# Patient Record
Sex: Female | Born: 2000 | Race: Black or African American | Hispanic: No | Marital: Single | Smoking: Never smoker
Health system: Southern US, Community
[De-identification: ages and names within clinical notes are randomized; demographics above are authoritative.]

---

## 2019-01-11 ENCOUNTER — Encounter (HOSPITAL_COMMUNITY): Payer: Self-pay | Admitting: Obstetrics and Gynecology

## 2019-01-11 ENCOUNTER — Other Ambulatory Visit: Payer: Self-pay

## 2019-01-11 DIAGNOSIS — R070 Pain in throat: Secondary | ICD-10-CM | POA: Insufficient documentation

## 2019-01-11 NOTE — ED Triage Notes (Signed)
Pt reports she has been sick the last 4 days. Pt reports emesis, diarrhea and sore throat. Pt reports she feels like it is getting harder to swallow.

## 2019-01-12 ENCOUNTER — Emergency Department (HOSPITAL_COMMUNITY)
Admission: EM | Admit: 2019-01-12 | Discharge: 2019-01-12 | Disposition: A | Discharge status: Home / Self Care | Payer: Self-pay | Attending: Emergency Medicine | Admitting: Emergency Medicine

## 2019-01-12 DIAGNOSIS — J029 Acute pharyngitis, unspecified: Secondary | ICD-10-CM

## 2019-01-12 LAB — GROUP A STREP BY PCR: Group A Strep by PCR: NOT DETECTED

## 2019-01-12 LAB — MONONUCLEOSIS SCREEN: Mono Screen: NEGATIVE

## 2019-01-12 MED ORDER — LIDOCAINE VISCOUS HCL 2 % MT SOLN: 15.0000 mL | OROMUCOSAL | 0 refills | Status: DC | PRN

## 2019-01-12 NOTE — ED Provider Notes (Signed)
[facility] DEPT Provider Note   CSN: [id number] Arrival date & time: 01/11/19  2105     History   Chief Complaint Chief Complaint  Patient presents with  . Sore Throat    HPI Tiffany Cervantes is a 18 y.o. female.     The history is provided by the patient and medical records.    18 year old female presenting to the ED with multiple complaints.  She reports 4 days ago she started having nausea, vomiting, and diarrhea along with some abdominal cramping.  She was drinking tea and Pedialyte at home which seemed to help.  She has not thrown up since yesterday but reports of the past 2 days she has felt like there is something stuck in the right side of her throat.  States it has felt this way since she ate some rice.  States she is tried drinking a lot of fluids, eating bread, but sensation is still there.  She denies any current abdominal pain.  She is not had any fevers.  She denies any sick contacts.  She has not had any known COVID exposure.  She denies any cough, shortness of breath, or other respiratory symptoms.  No past medical history on file.  There are no active problems to display for this patient.      OB History    Gravida      Para      Term      Preterm      AB      Living  0     SAB      TAB      Ectopic      Multiple      Live Births               Home Medications    Prior to Admission medications   Not on File    Family History No family history on file.  Social History Social History   Tobacco Use  . Smoking status: Never Smoker  . Smokeless tobacco: Never Used  Substance Use Topics  . Alcohol use: Yes    Comment: Socially  . Drug use: Yes    Frequency: 7.0 times per week    Types: Marijuana     Allergies   Patient has no allergy information on record.   Review of Systems Review of Systems  HENT: Positive for sore throat.   All other systems reviewed and are negative.     Physical Exam Updated Vital Signs BP 120/87 (BP Location: Left Arm)   Pulse 62   Temp 98.9 F (37.2 C) (Oral)   Resp 18   Wt 59.1 kg   LMP 12/28/2018 (Approximate)   SpO2 98%   Physical Exam Vitals signs and nursing note reviewed.  Constitutional:      Appearance: She is well-developed.  HENT:     Head: Normocephalic and atraumatic.     Right Ear: Tympanic membrane and ear canal normal.     Left Ear: Tympanic membrane and ear canal normal.     Nose: Nose normal.     Mouth/Throat:     Lips: Pink.     Mouth: Mucous membranes are moist.     Pharynx: Oropharynx is clear.     Comments: Tonsils overall normal in appearance bilaterally without exudate; uvula midline without evidence of peritonsillar abscess; handling secretions appropriately; no difficulty swallowing or speaking; normal phonation without stridor Eyes:     Conjunctiva/sclera: Conjunctivae normal.  Pupils: Pupils are equal, round, and reactive to light.  Neck:     Musculoskeletal: Normal range of motion.     Comments: No lymphadenopathy, no masses, no thyromegaly noted Cardiovascular:     Rate and Rhythm: Normal rate and regular rhythm.     Heart sounds: Normal heart sounds.  Pulmonary:     Effort: Pulmonary effort is normal.     Breath sounds: Normal breath sounds.  Abdominal:     General: Bowel sounds are normal.     Palpations: Abdomen is soft.  Musculoskeletal: Normal range of motion.  Skin:    General: Skin is warm and dry.  Neurological:     Mental Status: She is alert and oriented to person, place, and time.      ED Treatments / Results  Labs (all labs ordered are listed, but only abnormal results are displayed) Labs Reviewed  GROUP A STREP BY PCR  MONONUCLEOSIS SCREEN    EKG    Radiology No results found.  Procedures Procedures (including critical care time)  Medications Ordered in ED Medications - No data to display   Initial Impression / Assessment and Plan / ED Course  I  have reviewed the triage vital signs and the nursing notes.  Pertinent labs & imaging results that were available during my care of the patient were reviewed by me and considered in my medical decision making (see chart for details).  18 year old female presenting to the ED with sore throat and sensation that something is "stuck" in the right side of her throat.  States she has felt this way since eating rice.  She did have some nausea, vomiting, diarrhea earlier in the week but this has since resolved.  She is afebrile and nontoxic in appearance.  Denies any sick exposures or known COVID exposures.  She does not have any tonsillar edema or exudates here.  No lymphadenopathy or masses.  She is handling secretions well, normal phonation without stridor.  Rapid strep and Monospot are both negative.  I discussed with the patient that there is still a possibility of COVID given national pandemic, however her symptoms are not overly concerning for such.  I have offered her COVID screen here, however she declined due to concern of discomfort.  We will have her monitor her symptoms closely at home, Rx viscous lidocaine for discomfort.  Close follow-up with PCP.  Return here for any new or acute changes.  Tiffany SchimkeBreona Cervantes was evaluated in Emergency Department on 01/12/2019 for the symptoms described in the history of present illness. She was evaluated in the context of the global COVID-19 pandemic, which necessitated consideration that the patient might be at risk for infection with the SARS-CoV-2 virus that causes COVID-19. Institutional protocols and algorithms that pertain to the evaluation of patients at risk for COVID-19 are in a state of rapid change based on information released by regulatory bodies including the CDC and federal and state organizations. These policies and algorithms were followed during the patient's care in the ED.   Final Clinical Impressions(s) / ED Diagnoses   Final diagnoses:  Sore  throat    ED Discharge Orders         Ordered    lidocaine (XYLOCAINE) 2 % solution  As needed     01/12/19 0313           Garlon HatchetSanders, Riti Rollyson M, PA-C 01/12/19 0326    Nira Connardama, Pedro Eduardo, MD 01/12/19 [phone]

## 2019-01-12 NOTE — ED Notes (Addendum)
Pt verbalized discharge instructions and follow up care. Alert and ambulatory. No iv. Pt calling [employer] to take her home

## 2019-01-12 NOTE — Discharge Instructions (Signed)
Take the prescribed medication as directed. Monitor your symptoms closely.  If you develop fever, cough, shortness of breath, or other concerning symptoms you will need to quarantine at home given presence of pandemic.  See CDC guidelines below. Follow-up with primary care doctor. Return to the ED for new or worsening symptoms.     Person Under Monitoring Name: Tiffany SchimkeBreona Cervantes  Location[place] [address] [place] [id number]   Infection Prevention Recommendations for Individuals Confirmed to have, or Being Evaluated for, 2019 Novel Coronavirus (COVID-19) Infection Who Receive Care at Home  Individuals who are confirmed to have, or are being evaluated for, COVID-19 should follow the prevention steps below until a healthcare provider or local or state health department says they can return to normal activities.  Stay home except to get medical care You should restrict activities outside your home, except for getting medical care. Do not go to work, school, or public areas, and do not use public transportation or taxis.  Call ahead before visiting your doctor Before your medical appointment, call the healthcare provider and tell them that you have, or are being evaluated for, COVID-19 infection. This will help the healthcare providers office take steps to keep other people from getting infected. Ask your healthcare provider to call the local or state health department.  Monitor your symptoms Seek prompt medical attention if your illness is worsening (e.g., difficulty breathing). Before going to your medical appointment, call the healthcare provider and tell them that you have, or are being evaluated for, COVID-19 infection. Ask your healthcare provider to call the local or state health department.  Wear a facemask You should wear a facemask that covers your nose and mouth when you are in the same room with other people and when you visit a healthcare provider. People who  live with or visit you should also wear a facemask while they are in the same room with you.  Separate yourself from other people in your home As much as possible, you should stay in a different room from other people in your home. Also, you should use a separate bathroom, if available.  Avoid sharing household items You should not share dishes, drinking glasses, cups, eating utensils, towels, bedding, or other items with other people in your home. After using these items, you should wash them thoroughly with soap and water.  Cover your coughs and sneezes Cover your mouth and nose with a tissue when you cough or sneeze, or you can cough or sneeze into your sleeve. Throw used tissues in a lined trash can, and immediately wash your hands with soap and water for at least 20 seconds or use an alcohol-based hand rub.  Wash your [employer]hands Wash your hands often and thoroughly with soap and water for at least 20 seconds. You can use an alcohol-based hand sanitizer if soap and water are not available and if your hands are not visibly dirty. Avoid touching your eyes, nose, and mouth with unwashed hands.   Prevention Steps for Caregivers and Household Members of Individuals Confirmed to have, or Being Evaluated for, COVID-19 Infection Being Cared for in the Home  If you live with, or provide care at home for, a person confirmed to have, or being evaluated for, COVID-19 infection please follow these guidelines to prevent infection:  Follow healthcare providers instructions Make sure that you understand and can help the patient follow any healthcare provider instructions for all care.  Provide for the patients basic needs You should help the  patient with basic needs in the home and provide support for getting groceries, prescriptions, and other personal needs.  Monitor the patients symptoms If they are getting sicker, call his or her medical provider and tell them that the patient has, or is  being evaluated for, COVID-19 infection. This will help the healthcare providers office take steps to keep other people from getting infected. Ask the healthcare provider to call the local or state health department.  Limit the number of people who have contact with the patient If possible, have only one caregiver for the patient. Other household members should stay in another home or place of residence. If this is not possible, they should stay in another room, or be separated from the patient as much as possible. Use a separate bathroom, if available. Restrict visitors who do not have an essential need to be in the home.  Keep older adults, very young children, and other sick people away from the patient Keep older adults, very young children, and those who have compromised immune systems or chronic health conditions away from the patient. This includes people with chronic heart, lung, or kidney conditions, diabetes, and cancer.  Ensure good ventilation Make sure that shared spaces in the home have good air flow, such as from an air conditioner or an opened window, weather permitting.  Wash your hands often Wash your hands often and thoroughly with soap and water for at least 20 seconds. You can use an alcohol based hand sanitizer if soap and water are not available and if your hands are not visibly dirty. Avoid touching your eyes, nose, and mouth with unwashed hands. Use disposable paper towels to dry your hands. If not available, use dedicated cloth towels and replace them when they become wet.  Wear a facemask and gloves Wear a disposable facemask at all times in the room and gloves when you touch or have contact with the patients blood, body fluids, and/or secretions or excretions, such as sweat, saliva, sputum, nasal mucus, vomit, urine, or feces.  Ensure the mask fits over your nose and mouth tightly, and do not touch it during use. Throw out disposable facemasks and gloves after  using them. Do not reuse. Wash your hands immediately after removing your facemask and gloves. If your personal clothing becomes contaminated, carefully remove clothing and launder. Wash your hands after handling contaminated clothing. Place all used disposable facemasks, gloves, and other waste in a lined container before disposing them with other household waste. Remove gloves and wash your hands immediately after handling these items.  Do not share dishes, glasses, or other household items with the patient Avoid sharing household items. You should not share dishes, drinking glasses, cups, eating utensils, towels, bedding, or other items with a patient who is confirmed to have, or being evaluated for, COVID-19 infection. After the person uses these items, you should wash them thoroughly with soap and water.  Wash laundry thoroughly Immediately remove and wash clothes or bedding that have blood, body fluids, and/or secretions or excretions, such as sweat, saliva, sputum, nasal mucus, vomit, urine, or feces, on them. Wear gloves when handling laundry from the patient. Read and follow directions on labels of laundry or clothing items and detergent. In general, wash and dry with the warmest temperatures recommended on the label.  Clean all areas the individual has used often Clean all touchable surfaces, such as counters, tabletops, doorknobs, bathroom fixtures, toilets, phones, keyboards, tablets, and bedside tables, every day. Also, clean any surfaces that  may have blood, body fluids, and/or secretions or excretions on them. Wear gloves when cleaning surfaces the patient has come in contact with. Use a diluted bleach solution (e.g., dilute bleach with 1 part bleach and 10 parts water) or a household disinfectant with a label that says EPA-registered for coronaviruses. To make a bleach solution at home, add 1 tablespoon of bleach to 1 quart (4 cups) of water. For a larger supply, add  cup of bleach  to 1 gallon (16 cups) of water. Read labels of cleaning products and follow recommendations provided on product labels. Labels contain instructions for safe and effective use of the cleaning product including precautions you should take when applying the product, such as wearing gloves or eye protection and making sure you have good ventilation during use of the product. Remove gloves and wash hands immediately after cleaning.  Monitor yourself for signs and symptoms of illness Caregivers and household members are considered close contacts, should monitor their health, and will be asked to limit movement outside of the home to the extent possible. Follow the monitoring steps for close contacts listed on the symptom monitoring form.   ? If you have additional questions, contact your local health department or call the epidemiologist on call at [phone] (available 24/7). ? This guidance is subject to change. For the most up-to-date guidance from [facility], please refer to their website: [email or web address]

## 2019-01-13 ENCOUNTER — Emergency Department (HOSPITAL_COMMUNITY)
Admission: EM | Admit: 2019-01-13 | Discharge: 2019-01-13 | Disposition: A | Discharge status: Home / Self Care | Payer: Self-pay | Attending: Emergency Medicine | Admitting: Emergency Medicine

## 2019-01-13 DIAGNOSIS — R10817 Generalized abdominal tenderness: Secondary | ICD-10-CM | POA: Insufficient documentation

## 2019-01-13 DIAGNOSIS — J029 Acute pharyngitis, unspecified: Secondary | ICD-10-CM | POA: Insufficient documentation

## 2019-01-13 DIAGNOSIS — R112 Nausea with vomiting, unspecified: Secondary | ICD-10-CM | POA: Insufficient documentation

## 2019-01-13 DIAGNOSIS — F1290 Cannabis use, unspecified, uncomplicated: Secondary | ICD-10-CM | POA: Insufficient documentation

## 2019-01-13 DIAGNOSIS — R197 Diarrhea, unspecified: Secondary | ICD-10-CM | POA: Insufficient documentation

## 2019-01-13 MED ORDER — ONDANSETRON HCL 4 MG PO TABS: 4.0000 mg | ORAL_TABLET | Freq: Three times a day (TID) | ORAL | 0 refills | Status: DC | PRN

## 2019-01-13 NOTE — ED Triage Notes (Signed)
Patient reports she is having sore throat, nausea, vomiting, and diarrhea. She reports being at [facility] last night and test "were negative." She is here to see if we are able to help her.  Two episodes of vomiting and diarrhea in the last 24 hours.

## 2019-01-13 NOTE — ED Provider Notes (Signed)
[facility] EMERGENCY DEPARTMENT Provider Note   CSN: [id number] Arrival date & time: 01/13/19  1408     History   Chief Complaint Chief Complaint  Patient presents with  . Sore Throat  . emesis and diarrhea    HPI Tiffany Cervantes is a 18 y.o. female.     The history is provided by the patient and medical records. No language interpreter was used.  Sore Throat     18 year old female presenting with multiple complaints.  For the past 5 to 6 days patient has had throat irritation, with associated nausea vomiting diarrhea and abdominal cramping.  She endorsed approximately 2 episodes of nonbloody nonbilious vomiting as well as having loose stools approximately 2 episodes per day.  Symptom has been waxing waning.  It was improving several days ago but after eating a lot of cookout and fast food, she endorsed her symptoms return.  She endorsed discomfort with her throat when she vomits she felt something irritating the back of her throat.  She was seen at [facility] long ER yesterday for her complaint.  Strep and mono test was negative.  She was offered COVID-19 testing but declined.  She denies any recent sick contact.  She does not complain of any fever, productive cough, shortness of breath or chest pain.  She endorsed mild abdominal discomfort and describes cramping and rates pain as 4/10.  No dysuria.  No recent antibiotic use.  No past medical history on file.  There are no active problems to display for this patient.   The histories are not reviewed yet. Please review them in the "History" navigator section and refresh this Abanda.   OB History    Gravida      Para      Term      Preterm      AB      Living  0     SAB      TAB      Ectopic      Multiple      Live Births               Home Medications    Prior to Admission medications   Medication Sig Start Date End Date Taking? Authorizing Provider  lidocaine (XYLOCAINE) 2 %  solution Use as directed 15 mLs in the mouth or throat as needed for mouth pain. 01/12/19   Larene Pickett, PA-C    Family History No family history on file.  Social History Social History   Tobacco Use  . Smoking status: Never Smoker  . Smokeless tobacco: Never Used  Substance Use Topics  . Alcohol use: Yes    Comment: Socially  . Drug use: Yes    Frequency: 7.0 times per week    Types: Marijuana     Allergies   Patient has no allergy information on record.   Review of Systems Review of Systems  All other systems reviewed and are negative.    Physical Exam Updated Vital Signs BP (!) 139/93   Pulse 70   Temp 98.6 F (37 C) (Oral)   Resp 20   LMP 12/28/2018 (Approximate)   SpO2 98%   Physical Exam Vitals signs and nursing note reviewed.  Constitutional:      General: She is not in acute distress.    Appearance: She is well-developed.  HENT:     Head: Atraumatic.     Nose: Nose normal.     Mouth/Throat:  Mouth: Mucous membranes are moist.     Pharynx: Oropharynx is clear. No oropharyngeal exudate or posterior oropharyngeal erythema.  Eyes:     Conjunctiva/sclera: Conjunctivae normal.  Neck:     Musculoskeletal: Neck supple.  Cardiovascular:     Rate and Rhythm: Normal rate and regular rhythm.     Pulses: Normal pulses.     Heart sounds: Normal heart sounds.  Pulmonary:     Effort: Pulmonary effort is normal.     Breath sounds: Normal breath sounds.  Abdominal:     General: Abdomen is flat.     Palpations: Abdomen is soft.     Tenderness: There is abdominal tenderness (mild diffused abd tenderness without guarding or rebound tenderness).  Skin:    Findings: No rash.  Neurological:     Mental Status: She is alert and oriented to person, place, and time.      ED Treatments / Results  Labs (all labs ordered are listed, but only abnormal results are displayed) Labs Reviewed - No data to display  EKG None  Radiology No results found.   Procedures Procedures (including critical care time)  Medications Ordered in ED Medications - No data to display   Initial Impression / Assessment and Plan / ED Course  I have reviewed the triage vital signs and the nursing notes.  Pertinent labs & imaging results that were available during my care of the patient were reviewed by me and considered in my medical decision making (see chart for details).        BP (!) 139/93   Pulse 70   Temp 98.6 F (37 C) (Oral)   Resp 20   LMP 12/28/2018 (Approximate)   SpO2 98%    Final Clinical Impressions(s) / ED Diagnoses   Final diagnoses:  Nausea vomiting and diarrhea  Sore throat    ED Discharge Orders         Ordered    ondansetron (ZOFRAN) 4 MG tablet  Every 8 hours PRN     01/13/19 1448         2:44 PM Pt here with n/v/d for the past few days.  She has mild diffused abd discomfort. No peritoneal sign.  Throat exam unremarkable.  [facility]  Strep and mono test negative.  Pt currently resting comfortably, vital sign stable, afebrile.  Her primary concern is the type of food that she can eat.  I recommend bland food. Will prescribed antinausea medication.  Pt understand to return if condition worsen.  Currently low suspicion for appy or other acute medical emergency.  Low suspicion for covid-19  Jolene SchimkeBreona Allmendinger was evaluated in Emergency Department on 01/13/2019 for the symptoms described in the history of present illness. She was evaluated in the context of the global COVID-19 pandemic, which necessitated consideration that the patient might be at risk for infection with the SARS-CoV-2 virus that causes COVID-19. Institutional protocols and algorithms that pertain to the evaluation of patients at risk for COVID-19 are in a state of rapid change based on information released by regulatory bodies including the CDC and federal and state organizations. These policies and algorithms were followed during the patient's  care in the ED.    Fayrene Helperran, Talbert Trembath, PA-C 01/13/19 1449    Tegeler, Canary Brimhristopher J, MD 01/13/19 [phone]

## 2019-02-18 ENCOUNTER — Encounter: Payer: Self-pay | Admitting: Emergency Medicine

## 2019-02-18 ENCOUNTER — Ambulatory Visit
Admission: EM | Admit: 2019-02-18 | Discharge: 2019-02-18 | Disposition: A | Discharge status: Home / Self Care | Payer: Self-pay | Attending: Emergency Medicine | Admitting: Emergency Medicine

## 2019-02-18 ENCOUNTER — Other Ambulatory Visit: Payer: Self-pay

## 2019-02-18 ENCOUNTER — Ambulatory Visit (INDEPENDENT_AMBULATORY_CARE_PROVIDER_SITE_OTHER): Payer: Self-pay

## 2019-02-18 DIAGNOSIS — S8011XA Contusion of right lower leg, initial encounter: Secondary | ICD-10-CM

## 2019-02-18 MED ORDER — NAPROXEN 500 MG PO TABS: 500.0000 mg | ORAL_TABLET | Freq: Two times a day (BID) | ORAL | 0 refills | Status: AC
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
## 2019-02-18 NOTE — Discharge Instructions (Signed)
Take Naprosyn as prescribed. Important to do stretches, take hot showers, Epson salt soaks for additional muscle pain relief. Return if you develop worsening pain, difficulty walking.
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
## 2019-04-14 ENCOUNTER — Emergency Department (HOSPITAL_COMMUNITY)
Admission: EM | Admit: 2019-04-14 | Discharge: 2019-04-15 | Disposition: A | Discharge status: Home / Self Care | Payer: Self-pay | Attending: Emergency Medicine | Admitting: Emergency Medicine

## 2019-04-14 ENCOUNTER — Other Ambulatory Visit: Payer: Self-pay

## 2019-04-14 ENCOUNTER — Encounter (HOSPITAL_COMMUNITY): Payer: Self-pay

## 2019-04-14 DIAGNOSIS — R21 Rash and other nonspecific skin eruption: Secondary | ICD-10-CM | POA: Insufficient documentation

## 2019-04-14 DIAGNOSIS — L299 Pruritus, unspecified: Secondary | ICD-10-CM | POA: Insufficient documentation

## 2019-04-14 DIAGNOSIS — T7840XA Allergy, unspecified, initial encounter: Secondary | ICD-10-CM | POA: Insufficient documentation

## 2019-04-14 DIAGNOSIS — Z5321 Procedure and treatment not carried out due to patient leaving prior to being seen by health care provider: Secondary | ICD-10-CM | POA: Insufficient documentation
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
## 2019-04-14 NOTE — ED Triage Notes (Signed)
Pt arrived EMS from home c/o itching and rash after eating Chipotle last night. Patient ate Chipotle again tonight and had the same reaction. Pt has generalized itching all over and right sided rash up her whole body. Pt took 25 mg benadryl at home and EMS gave her another 25 mg at 2220. Pt denies difficulty breathing or throat/mouth swelling.

## 2019-04-23 ENCOUNTER — Ambulatory Visit (HOSPITAL_COMMUNITY)
Admission: RE | Admit: 2019-04-23 | Discharge: 2019-04-23 | Disposition: A | Discharge status: Home / Self Care | Payer: Self-pay | Attending: Psychiatry | Admitting: Psychiatry

## 2019-04-23 DIAGNOSIS — F331 Major depressive disorder, recurrent, moderate: Secondary | ICD-10-CM | POA: Insufficient documentation

## 2019-04-23 NOTE — H&P (Signed)
Behavioral Health Medical Screening Exam  Tiffany Cervantes is an 18 y.o. female patient presents as walk in at [facility] with complaints of depression and seeking outpatient psychiatric services.  Patient denies prior psychiatric history, inpatient or outpatient services.  Patient also denies suicidal/self-harm/homicidal ideation, psychosis, and paranoia.  Patient states that she has supportive friends and family and lives with a roommate.  Patient is also employed.    Total Time spent with patient: 30 minutes  Psychiatric Specialty Exam: Physical Exam  Constitutional: She is oriented to person, place, and time. She appears well-developed and well-nourished. No distress.  Neck: Normal range of motion.  Respiratory: Effort normal.  Musculoskeletal: Normal range of motion.  Neurological: She is alert and oriented to person, place, and time.  Skin: Skin is warm and dry.  Psychiatric: Judgment and thought content normal. Thought content is not paranoid. Cognition and memory are normal. She does not exhibit a depressed mood. She expresses no homicidal and no suicidal ideation.    Review of Systems  Constitutional:       History of multiple medical problems  Psychiatric/Behavioral: Negative for depression (Stable) and suicidal ideas. Hallucinations: Denies. The patient is not nervous/anxious.   All other systems reviewed and are negative.   Last menstrual period 03/24/2019.There is no height or weight on file to calculate BMI.  General Appearance: Casual and Neat  Eye Contact:  Good  Speech:  Clear and Coherent and Normal Rate  Volume:  Normal  Mood:  Depressed  Affect:  Appropriate, Congruent and Depressed  Thought Process:  Coherent, Goal Directed and Descriptions of Associations: Intact  Orientation:  Full (Time, Place, and Person)  Thought Content:  WDL and Logical  Suicidal Thoughts:  No  Homicidal Thoughts:  No  Memory:  Immediate;   Good Recent;   Good  Judgement:  Intact  Insight:   Good and Present  Psychomotor Activity:  Normal  Concentration: Concentration: Good and Attention Span: Good  Recall:  Good  Fund of Knowledge:Good  Language: Good  Akathisia:  No  Handed:  Right  AIMS (if indicated):     Assets:  Communication Skills Desire for Improvement Financial Resources/Insurance Housing Physical Health Social Support Transportation  Sleep:       Musculoskeletal: Strength & Muscle Tone: within normal limits Gait & Station: normal Patient leans: N/A  Last menstrual period 03/24/2019.  Recommendations:  Outpatient psychiatric services.  Resources given  Based on my evaluation the patient does not appear to have an emergency medical condition.   Disposition: No evidence of imminent risk to self or others at present.   Patient does not meet criteria for psychiatric inpatient admission. Supportive therapy provided about ongoing stressors. Discussed crisis plan, support from social network, calling 911, coming to the Emergency Department, and calling Suicide Hotline.  Lynda Capistran, NP 04/23/2019, 2:16 PM
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
## 2019-10-19 ENCOUNTER — Other Ambulatory Visit: Payer: Self-pay

## 2019-10-20 ENCOUNTER — Ambulatory Visit: Payer: Self-pay | Admitting: Obstetrics and Gynecology

## 2020-04-20 ENCOUNTER — Encounter (HOSPITAL_COMMUNITY): Payer: Self-pay

## 2020-04-20 ENCOUNTER — Emergency Department (HOSPITAL_COMMUNITY)
Admission: EM | Admit: 2020-04-20 | Discharge: 2020-04-21 | Disposition: A | Discharge status: Home / Self Care | Payer: Medicaid Other | Attending: Emergency Medicine | Admitting: Emergency Medicine

## 2020-04-20 ENCOUNTER — Emergency Department (HOSPITAL_COMMUNITY): Payer: Medicaid Other

## 2020-04-20 DIAGNOSIS — Z5321 Procedure and treatment not carried out due to patient leaving prior to being seen by health care provider: Secondary | ICD-10-CM | POA: Insufficient documentation

## 2020-04-20 DIAGNOSIS — R1110 Vomiting, unspecified: Secondary | ICD-10-CM | POA: Diagnosis not present

## 2020-04-20 DIAGNOSIS — R079 Chest pain, unspecified: Secondary | ICD-10-CM | POA: Diagnosis not present

## 2020-04-20 DIAGNOSIS — R109 Unspecified abdominal pain: Secondary | ICD-10-CM | POA: Insufficient documentation

## 2020-04-20 LAB — CBC
HCT: 43.5 % (ref 36.0–46.0)
Hemoglobin: 14.3 g/dL (ref 12.0–15.0)
MCH: 26.6 pg (ref 26.0–34.0)
MCHC: 32.9 g/dL (ref 30.0–36.0)
MCV: 81 fL (ref 80.0–100.0)
Platelets: 343 10*3/uL (ref 150–400)
RBC: 5.37 MIL/uL — ABNORMAL HIGH (ref 3.87–5.11)
RDW: 12.8 % (ref 11.5–15.5)
WBC: 7.1 10*3/uL (ref 4.0–10.5)
nRBC: 0 % (ref 0.0–0.2)

## 2020-04-20 LAB — TROPONIN I (HIGH SENSITIVITY): Troponin I (High Sensitivity): 3 ng/L (ref <18)

## 2020-04-20 LAB — COMPREHENSIVE METABOLIC PANEL
ALT: 12 U/L (ref 0–44)
AST: 22 U/L (ref 15–41)
Albumin: 3.9 g/dL (ref 3.5–5.0)
Alkaline Phosphatase: 52 U/L (ref 38–126)
Anion gap: 9 (ref 5–15)
BUN: 9 mg/dL (ref 6–20)
CO2: 23 mmol/L (ref 22–32)
Calcium: 9.5 mg/dL (ref 8.9–10.3)
Chloride: 104 mmol/L (ref 98–111)
Creatinine, Ser: 0.86 mg/dL (ref 0.44–1.00)
GFR calc non Af Amer: >60 mL/min (ref >60)
Glucose, Bld: 93 mg/dL (ref 70–99)
Potassium: 3.8 mmol/L (ref 3.5–5.1)
Sodium: 136 mmol/L (ref 135–145)
Total Bilirubin: 0.3 mg/dL (ref 0.3–1.2)
Total Protein: 7.1 g/dL (ref 6.5–8.1)

## 2020-04-20 LAB — I-STAT BETA HCG BLOOD, ED (MC, WL, AP ONLY): I-stat hCG, quantitative: <5 m[IU]/mL (ref <5)

## 2020-04-20 LAB — LIPASE, BLOOD: Lipase: 71 U/L — ABNORMAL HIGH (ref 11–51)

## 2020-04-20 NOTE — ED Triage Notes (Signed)
Pt reports emesis, abd pain and chest pain since Tuesday. Pt thinks she has food poisoning from spaghetti. Reports 4 episodes of vomiting today

## 2020-04-21 LAB — URINALYSIS, ROUTINE W REFLEX MICROSCOPIC
Bacteria, UA: NONE SEEN
Bilirubin Urine: NEGATIVE
Glucose, UA: NEGATIVE mg/dL
Hgb urine dipstick: NEGATIVE
Ketones, ur: NEGATIVE mg/dL
Nitrite: NEGATIVE
Protein, ur: NEGATIVE mg/dL
Specific Gravity, Urine: 1.016 (ref 1.005–1.030)
pH: 5 (ref 5.0–8.0)

## 2020-04-21 NOTE — ED Notes (Addendum)
Pt had a room. Pt said she was leaving and going home. Pt said she already called for ride

## 2021-02-15 ENCOUNTER — Encounter (HOSPITAL_BASED_OUTPATIENT_CLINIC_OR_DEPARTMENT_OTHER): Payer: Self-pay | Admitting: Emergency Medicine

## 2021-02-15 ENCOUNTER — Emergency Department (HOSPITAL_BASED_OUTPATIENT_CLINIC_OR_DEPARTMENT_OTHER)
Admission: EM | Admit: 2021-02-15 | Discharge: 2021-02-15 | Disposition: A | Discharge status: Home / Self Care | Payer: Medicaid Other | Attending: Emergency Medicine | Admitting: Emergency Medicine

## 2021-02-15 ENCOUNTER — Other Ambulatory Visit: Payer: Self-pay

## 2021-02-15 DIAGNOSIS — U071 COVID-19: Secondary | ICD-10-CM | POA: Insufficient documentation

## 2021-02-15 DIAGNOSIS — Z28310 Unvaccinated for covid-19: Secondary | ICD-10-CM | POA: Insufficient documentation

## 2021-02-15 DIAGNOSIS — R519 Headache, unspecified: Secondary | ICD-10-CM | POA: Diagnosis present

## 2021-02-15 LAB — RESP PANEL BY RT-PCR (FLU A&B, COVID) ARPGX2
Influenza A by PCR: NEGATIVE
Influenza B by PCR: NEGATIVE
SARS Coronavirus 2 by RT PCR: POSITIVE — AB

## 2021-02-15 MED ORDER — ACETAMINOPHEN 325 MG PO TABS
650.0000 mg | ORAL_TABLET | Freq: Once | ORAL | Status: AC
  Administered 2021-02-15: 650 mg via ORAL
  Filled 2021-02-15: qty 2

## 2021-02-15 NOTE — ED Triage Notes (Addendum)
Pt presents to ED POV. Pt c/o HA, malaise, and chills x3d. Never took temp at home. Pt reports having "diarrhea" today but took laxative yesterday. Pt denies sick contacts.

## 2021-02-15 NOTE — ED Provider Notes (Signed)
[facility] EMERGENCY DEPT Provider Note   CSN: [id number] Arrival date & time: 02/15/21  0037     History Chief Complaint  Patient presents with  . Headache    Tiffany Cervantes is a 20 y.o. female.  The history is provided by the patient.  Headache Pain location:  Generalized Quality:  Dull Radiates to:  Does not radiate Onset quality:  Gradual Duration:  3 days Timing:  Constant Progression:  Unchanged Chronicity:  New Context: not eating and not stress   Relieved by:  Nothing Worsened by:  Nothing Ineffective treatments:  None tried Associated symptoms: diarrhea, fever and myalgias   Associated symptoms: no back pain, no congestion, no cough, no eye pain, no facial pain, no focal weakness, no near-syncope, no neck pain, no neck stiffness, no numbness, no paresthesias, no sore throat, no swollen glands, no visual change and no vomiting   Not vaccinated for covid 19.      History reviewed. No pertinent past medical history.  There are no problems to display for this patient.   History reviewed. No pertinent surgical history.   OB History     Gravida      Para      Term      Preterm      AB      Living  0      SAB      IAB      Ectopic      Multiple      Live Births              Family History  Problem Relation Age of Onset  . Healthy Mother   . Healthy Father     Social History   Tobacco Use  . Smoking status: Never  . Smokeless tobacco: Never  Vaping Use  . Vaping Use: Never used  Substance Use Topics  . Alcohol use: Yes    Comment: Socially  . Drug use: Yes    Frequency: 7.0 times per week    Types: Marijuana    Home Medications Prior to Admission medications   Medication Sig Start Date End Date Taking? Authorizing Provider  naproxen (NAPROSYN) 500 MG tablet Take 1 tablet (500 mg total) by mouth 2 (two) times daily. 02/18/19   Hall-Potvin, [place], PA-C    Allergies    Patient has no known  allergies.  Review of Systems   Review of Systems  Constitutional:  Positive for fever.  HENT:  Negative for congestion and sore throat.   Eyes:  Negative for pain.  Respiratory:  Negative for cough.   Cardiovascular:  Negative for near-syncope.  Gastrointestinal:  Positive for diarrhea. Negative for vomiting.  Genitourinary:  Negative for difficulty urinating.  Musculoskeletal:  Positive for myalgias. Negative for back pain, neck pain and neck stiffness.  Skin:  Negative for rash.  Neurological:  Positive for headaches. Negative for focal weakness, numbness and paresthesias.  Psychiatric/Behavioral:  Negative for agitation.   All other systems reviewed and are negative.  Physical Exam Updated Vital Signs BP 106/82 (BP Location: Right Arm)   Pulse (!) 102   Temp 100 F (37.8 C) (Oral)   Resp 20   Ht 5\' 4"  (1.626 m)   Wt 56.7 kg   LMP 02/01/2021   SpO2 100%   BMI 21.46 kg/m   Physical Exam Vitals and nursing note reviewed.  Constitutional:      General: She is not in acute distress.    Appearance: Normal  appearance.  HENT:     Head: Normocephalic and atraumatic.     Nose: Nose normal.  Eyes:     Conjunctiva/sclera: Conjunctivae normal.     Pupils: Pupils are equal, round, and reactive to light.  Cardiovascular:     Rate and Rhythm: Normal rate and regular rhythm.     Pulses: Normal pulses.     Heart sounds: Normal heart sounds.  Pulmonary:     Effort: Pulmonary effort is normal.     Breath sounds: Normal breath sounds.  Abdominal:     General: Abdomen is flat. Bowel sounds are normal.     Palpations: Abdomen is soft.     Tenderness: There is no abdominal tenderness. There is no guarding.  Musculoskeletal:        General: Normal range of motion.     Cervical back: Normal range of motion and neck supple.  Skin:    General: Skin is warm and dry.     Capillary Refill: Capillary refill takes less than 2 seconds.  Neurological:     General: No focal deficit  present.     Mental Status: She is alert and oriented to person, place, and time.     Deep Tendon Reflexes: Reflexes normal.  Psychiatric:        Mood and Affect: Mood normal.        Behavior: Behavior normal.    ED Results / Procedures / Treatments   Labs (all labs ordered are listed, but only abnormal results are displayed) Labs Reviewed  RESP PANEL BY RT-PCR (FLU A&B, COVID) ARPGX2 - Abnormal; Notable for the following components:      Result Value   SARS Coronavirus 2 by RT PCR POSITIVE (*)    All other components within normal limits    EKG None  Radiology No results found.  Procedures Procedures   Medications Ordered in ED Medications  acetaminophen (TYLENOL) tablet 650 mg (650 mg Oral Given 02/15/21 0105)    ED Course  I have reviewed the triage vital signs and the nursing notes.  Pertinent labs & imaging results that were available during my care of the patient were reviewed by me and considered in my medical decision making (see chart for details).   Symptoms consistent with covid.  Strict isolation and quarantine instructions given.  Do not go out for restaurants or stores and do not mix with people.      Tiffany Cervantes was evaluated in Emergency Department on 02/15/2021 for the symptoms described in the history of present illness. She was evaluated in the context of the global COVID-19 pandemic, which necessitated consideration that the patient might be at risk for infection with the SARS-CoV-2 virus that causes COVID-19. Institutional protocols and algorithms that pertain to the evaluation of patients at risk for COVID-19 are in a state of rapid change based on information released by regulatory bodies including the CDC and federal and state organizations. These policies and algorithms were followed during the patient's care in the ED.  Final Clinical Impression(s) / ED Diagnoses Final diagnoses:  COVID-19    Return for intractable cough, coughing up blood,  fevers > 100.4 unrelieved by medication, shortness of breath, intractable vomiting, chest pain, shortness of breath, weakness, numbness, changes in speech, facial asymmetry, abdominal pain, passing out, Inability to tolerate liquids or food, cough, altered mental status or any concerns. No signs of systemic illness or infection. The patient is nontoxic-appearing on exam and vital signs are within normal limits. I have  reviewed the triage vital signs and the nursing notes. Pertinent labs & imaging results that were available during my care of the patient were reviewed by me and considered in my medical decision making (see chart for details). After history, exam, and medical workup I feel the patient has been appropriately medically screened and is safe for discharge home. Pertinent diagnoses were discussed with the patient. Patient was given return precautions. Rx / DC Orders ED Discharge Orders     None        Coren Crownover, MD 02/15/21 [phone]

## 2021-02-15 NOTE — Discharge Instructions (Addendum)
Person Under Monitoring Name: Tiffany Cervantes  Location: [address] [place] [address]   Infection Prevention Recommendations for Individuals Confirmed to have, or Being Evaluated for, 2019 Novel Coronavirus (COVID-19) Infection Who Receive Care at Home  Individuals who are confirmed to have, or are being evaluated for, COVID-19 should follow the prevention steps below until a healthcare provider or local or state health department says they can return to normal activities.  Stay home except to get medical care You should restrict activities outside your home, except for getting medical care. Do not go to work, school, or public areas, and do not use public transportation or taxis.  Call ahead before visiting your doctor Before your medical appointment, call the healthcare provider and tell them that you have, or are being evaluated for, COVID-19 infection. This will help the healthcare provider's office take steps to keep other people from getting infected. Ask your healthcare provider to call the local or state health department.  Monitor your symptoms Seek prompt medical attention if your illness is worsening (e.g., difficulty breathing). Before going to your medical appointment, call the healthcare provider and tell them that you have, or are being evaluated for, COVID-19 infection. Ask your healthcare provider to call the local or state health department.  Wear a facemask You should wear a facemask that covers your nose and mouth when you are in the same room with other people and when you visit a healthcare provider. People who live with or visit you should also wear a facemask while they are in the same room with you.  Separate yourself from other people in your home As much as possible, you should stay in a different room from other people in your home. Also, you should use a separate bathroom, if available.  Avoid sharing household items You  should not share dishes, drinking glasses, cups, eating utensils, towels, bedding, or other items with other people in your home. After using these items, you should wash them thoroughly with soap and water.  Cover your coughs and sneezes Cover your mouth and nose with a tissue when you cough or sneeze, or you can cough or sneeze into your sleeve. Throw used tissues in a lined trash can, and immediately wash your hands with soap and water for at least 20 seconds or use an alcohol-based hand rub.  Wash your [employer] your hands often and thoroughly with soap and water for at least 20 seconds. You can use an alcohol-based hand sanitizer if soap and water are not available and if your hands are not visibly dirty. Avoid touching your eyes, nose, and mouth with unwashed hands.   Prevention Steps for Caregivers and Household Members of Individuals Confirmed to have, or Being Evaluated for, COVID-19 Infection Being Cared for in the Home  If you live with, or provide care at home for, a person confirmed to have, or being evaluated for, COVID-19 infection please follow these guidelines to prevent infection:  Follow healthcare provider's instructions Make sure that you understand and can help the patient follow any healthcare provider instructions for all care.  Provide for the patient's basic needs You should help the patient with basic needs in the home and provide support for getting groceries, prescriptions, and other personal needs.  Monitor the patient's symptoms If they are getting sicker, call his or her medical provider and tell them that the patient has, or is being evaluated for, COVID-19 infection. This will help the healthcare provider's  office take steps to keep other people from getting infected. Ask the healthcare provider to call the local or state health department.  Limit the number of people who have contact with the patient If possible, have only one caregiver for the  patient. Other household members should stay in another home or place of residence. If this is not possible, they should stay in another room, or be separated from the patient as much as possible. Use a separate bathroom, if available. Restrict visitors who do not have an essential need to be in the home.  Keep older adults, very young children, and other sick people away from the patient Keep older adults, very young children, and those who have compromised immune systems or chronic health conditions away from the patient. This includes people with chronic heart, lung, or kidney conditions, diabetes, and cancer.  Ensure good ventilation Make sure that shared spaces in the home have good air flow, such as from an air conditioner or an opened window, weather permitting.  Wash your hands often Wash your hands often and thoroughly with soap and water for at least 20 seconds. You can use an alcohol based hand sanitizer if soap and water are not available and if your hands are not visibly dirty. Avoid touching your eyes, nose, and mouth with unwashed hands. Use disposable paper towels to dry your hands. If not available, use dedicated cloth towels and replace them when they become wet.  Wear a facemask and gloves Wear a disposable facemask at all times in the room and gloves when you touch or have contact with the patient's blood, body fluids, and/or secretions or excretions, such as sweat, saliva, sputum, nasal mucus, vomit, urine, or feces.  Ensure the mask fits over your nose and mouth tightly, and do not touch it during use. Throw out disposable facemasks and gloves after using them. Do not reuse. Wash your hands immediately after removing your facemask and gloves. If your personal clothing becomes contaminated, carefully remove clothing and launder. Wash your hands after handling contaminated clothing. Place all used disposable facemasks, gloves, and other waste in a lined container before  disposing them with other household waste. Remove gloves and wash your hands immediately after handling these items.  Do not share dishes, glasses, or other household items with the patient Avoid sharing household items. You should not share dishes, drinking glasses, cups, eating utensils, towels, bedding, or other items with a patient who is confirmed to have, or being evaluated for, COVID-19 infection. After the person uses these items, you should wash them thoroughly with soap and water.  Wash laundry thoroughly Immediately remove and wash clothes or bedding that have blood, body fluids, and/or secretions or excretions, such as sweat, saliva, sputum, nasal mucus, vomit, urine, or feces, on them. Wear gloves when handling laundry from the patient. Read and follow directions on labels of laundry or clothing items and detergent. In general, wash and dry with the warmest temperatures recommended on the label.  Clean all areas the individual has used often Clean all touchable surfaces, such as counters, tabletops, doorknobs, bathroom fixtures, toilets, phones, keyboards, tablets, and bedside tables, every day. Also, clean any surfaces that may have blood, body fluids, and/or secretions or excretions on them. Wear gloves when cleaning surfaces the patient has come in contact with. Use a diluted bleach solution (e.g., dilute bleach with 1 part bleach and 10 parts water) or a household disinfectant with a label that says EPA-registered for coronaviruses. To make a  bleach solution at home, add 1 tablespoon of bleach to 1 quart (4 cups) of water. For a larger supply, add  cup of bleach to 1 gallon (16 cups) of water. Read labels of cleaning products and follow recommendations provided on product labels. Labels contain instructions for safe and effective use of the cleaning product including precautions you should take when applying the product, such as wearing gloves or eye protection and making sure you  have good ventilation during use of the product. Remove gloves and wash hands immediately after cleaning.  Monitor yourself for signs and symptoms of illness Caregivers and household members are considered close contacts, should monitor their health, and will be asked to limit movement outside of the home to the extent possible. Follow the monitoring steps for close contacts listed on the symptom monitoring form.   ? If you have additional questions, contact your local health department or call the epidemiologist on call at [phone] (available 24/7). ? This guidance is subject to change. For the most up-to-date guidance from [facility], please refer to their website: [email or web address]

## 2021-08-14 IMAGING — DX DG CHEST 2V
2 series · 2 of 2 positions shown · non-contrast
Comparison: None.

CLINICAL DATA: Chest pain and vomiting for several days

EXAM:
CHEST - 2 VIEW

[chest pa]
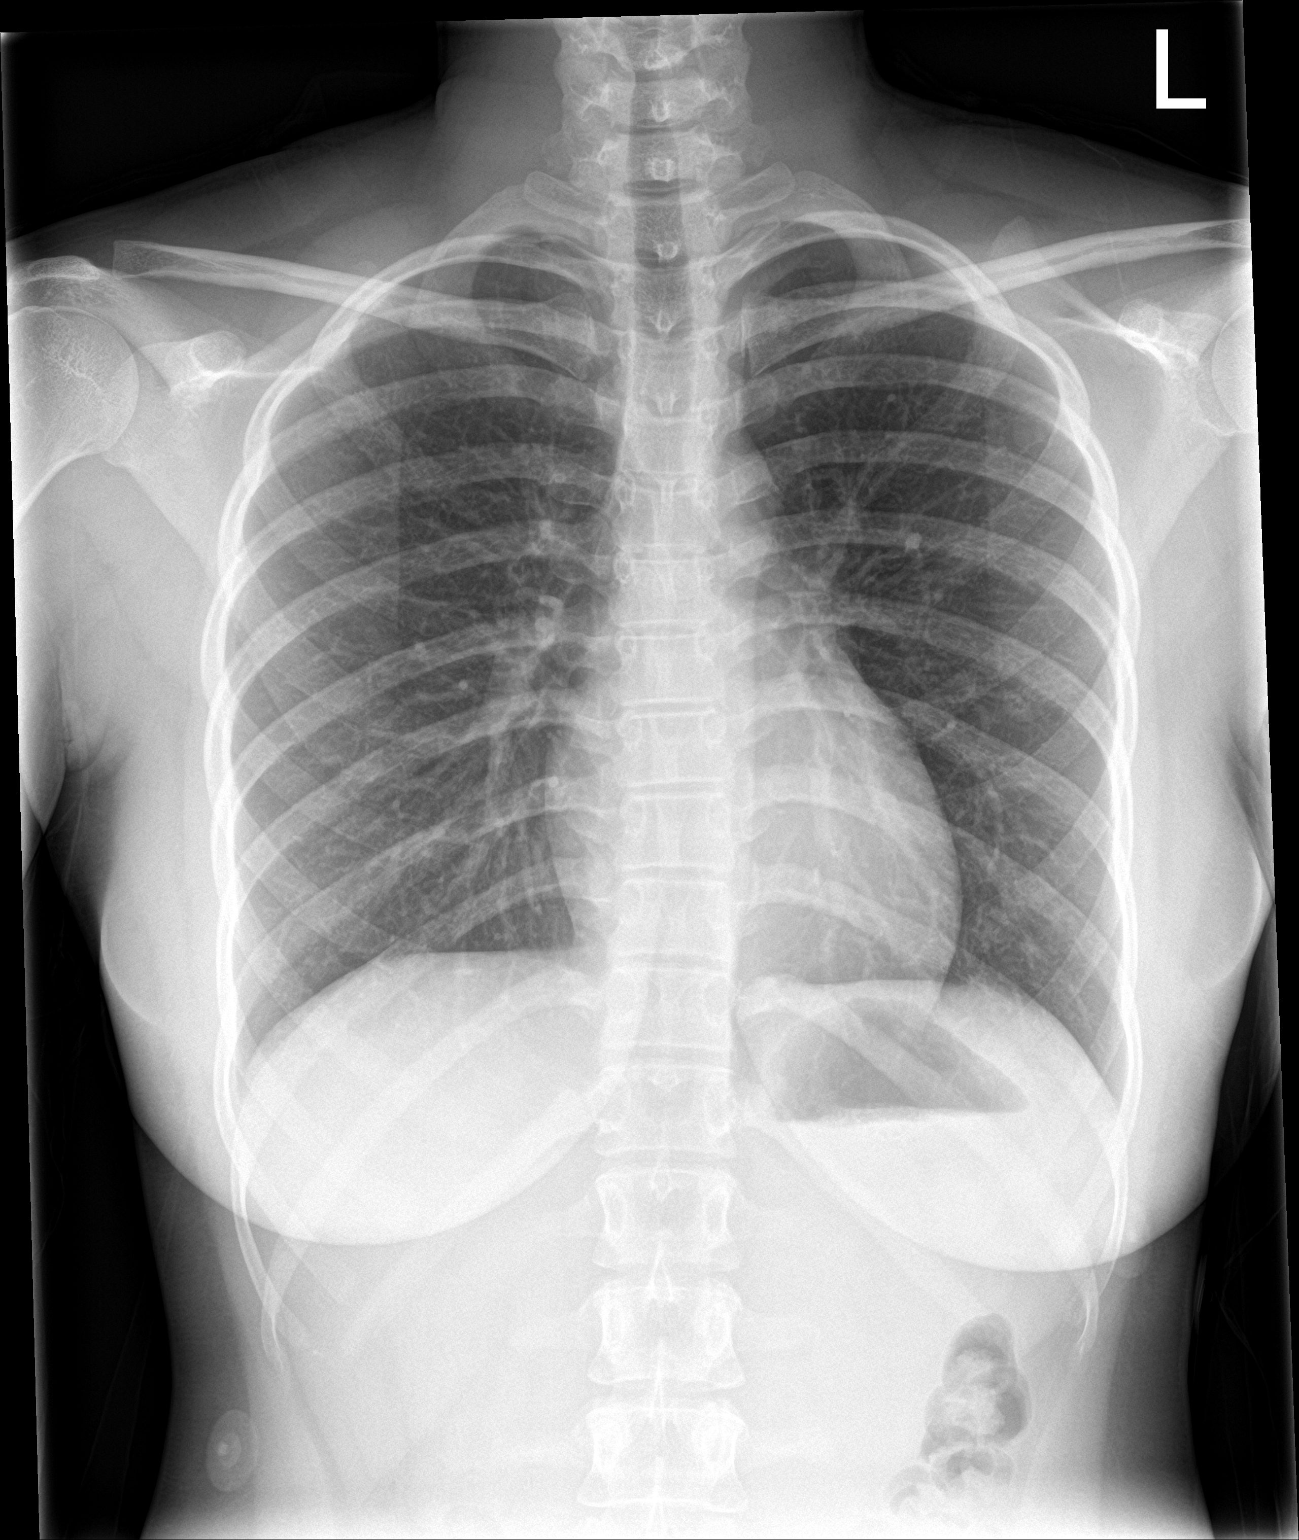

[chest lat]
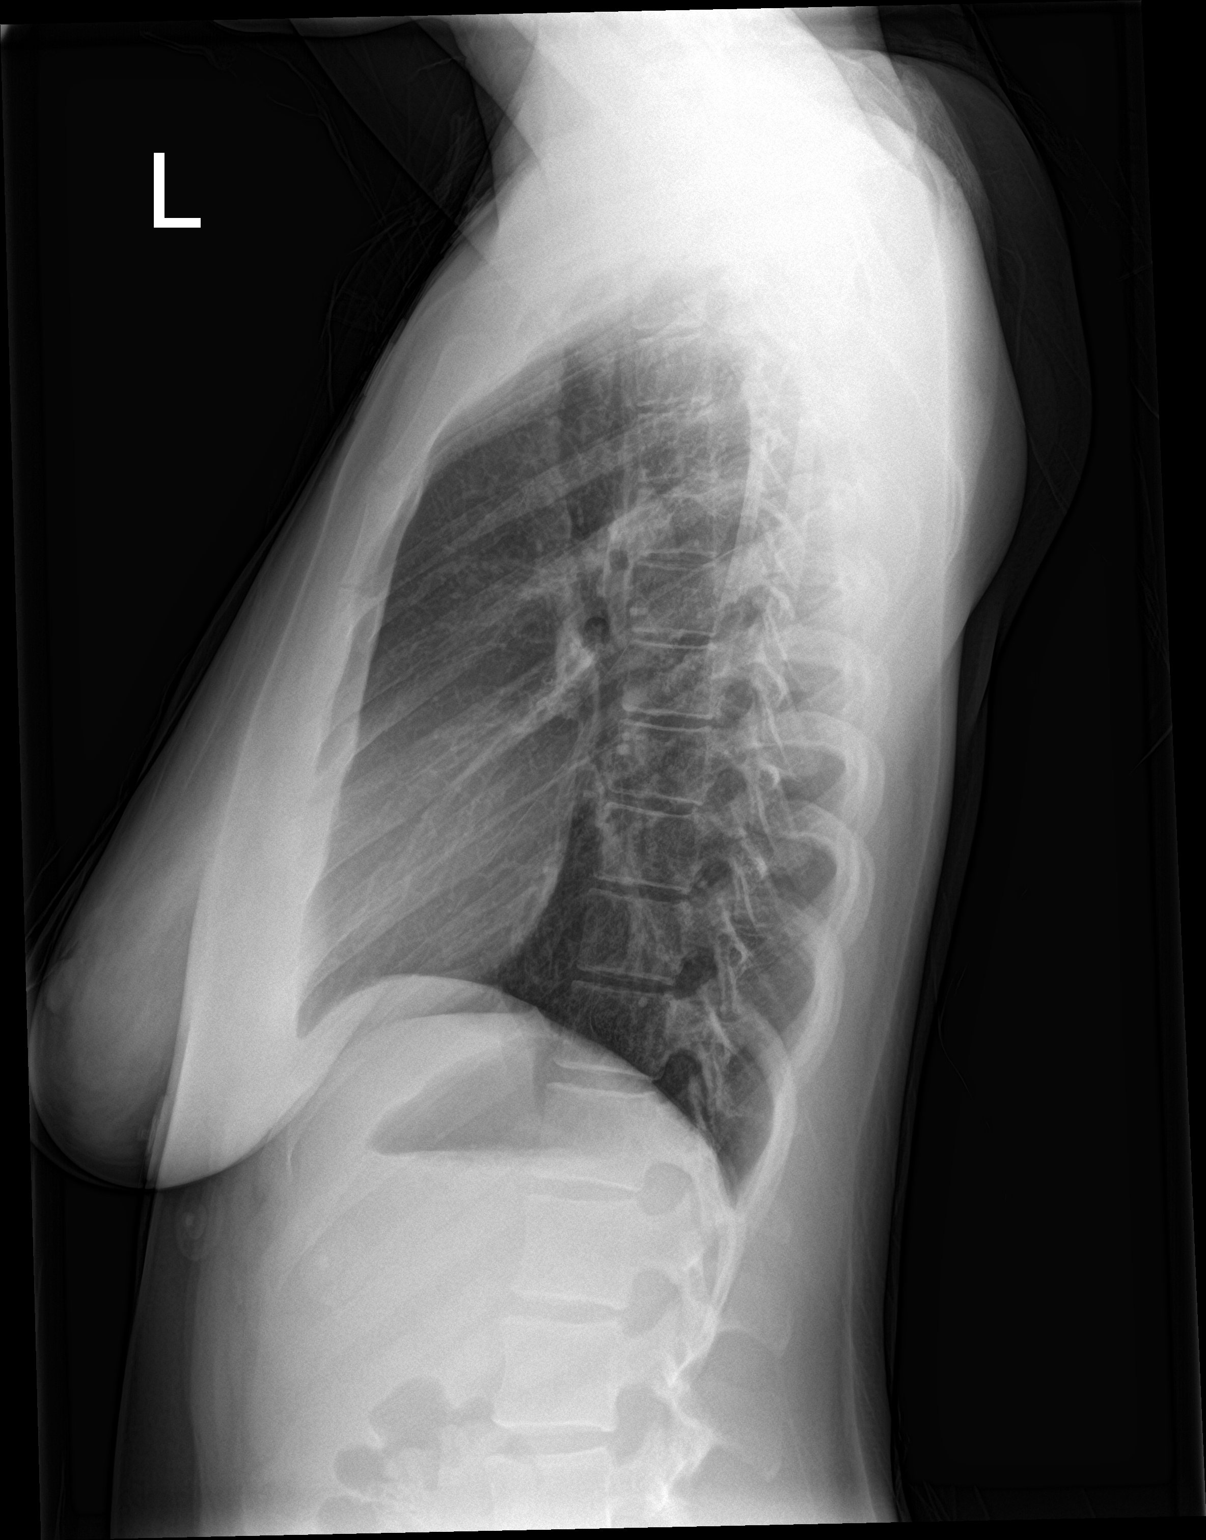

[2 of 2 positions shown; findings below may reference images not displayed]

FINDINGS: The heart size and mediastinal contours are within normal limits.
Both lungs are clear. The visualized skeletal structures are
unremarkable.
IMPRESSION: No active cardiopulmonary disease.

## 2021-12-31 ENCOUNTER — Emergency Department (HOSPITAL_COMMUNITY)
Admission: EM | Admit: 2021-12-31 | Discharge: 2022-01-01 | Disposition: A | Discharge status: Home / Self Care | Payer: BC Managed Care – PPO | Attending: Emergency Medicine | Admitting: Emergency Medicine

## 2021-12-31 ENCOUNTER — Other Ambulatory Visit: Payer: Self-pay

## 2021-12-31 ENCOUNTER — Encounter (HOSPITAL_COMMUNITY): Payer: Self-pay

## 2021-12-31 DIAGNOSIS — R1030 Lower abdominal pain, unspecified: Secondary | ICD-10-CM | POA: Diagnosis present

## 2021-12-31 DIAGNOSIS — N9489 Other specified conditions associated with female genital organs and menstrual cycle: Secondary | ICD-10-CM | POA: Diagnosis not present

## 2021-12-31 DIAGNOSIS — A599 Trichomoniasis, unspecified: Secondary | ICD-10-CM | POA: Insufficient documentation

## 2021-12-31 LAB — CBC WITH DIFFERENTIAL/PLATELET
Abs Immature Granulocytes: 0.02 10*3/uL (ref 0.00–0.07)
Basophils Absolute: 0 10*3/uL (ref 0.0–0.1)
Basophils Relative: 1 %
Eosinophils Absolute: 0.2 10*3/uL (ref 0.0–0.5)
Eosinophils Relative: 4 %
HCT: 40.2 % (ref 36.0–46.0)
Hemoglobin: 13.7 g/dL (ref 12.0–15.0)
Immature Granulocytes: 0 %
Lymphocytes Relative: 30 %
Lymphs Abs: 1.7 10*3/uL (ref 0.7–4.0)
MCH: 28.1 pg (ref 26.0–34.0)
MCHC: 34.1 g/dL (ref 30.0–36.0)
MCV: 82.5 fL (ref 80.0–100.0)
Monocytes Absolute: 0.5 10*3/uL (ref 0.1–1.0)
Monocytes Relative: 8 %
Neutro Abs: 3.3 10*3/uL (ref 1.7–7.7)
Neutrophils Relative %: 57 %
Platelets: 342 10*3/uL (ref 150–400)
RBC: 4.87 MIL/uL (ref 3.87–5.11)
RDW: 13.2 % (ref 11.5–15.5)
WBC: 5.7 10*3/uL (ref 4.0–10.5)
nRBC: 0 % (ref 0.0–0.2)

## 2021-12-31 LAB — URINALYSIS, MICROSCOPIC (REFLEX)

## 2021-12-31 LAB — URINALYSIS, ROUTINE W REFLEX MICROSCOPIC
Bilirubin Urine: NEGATIVE
Glucose, UA: NEGATIVE mg/dL
Hgb urine dipstick: NEGATIVE
Ketones, ur: NEGATIVE mg/dL
Nitrite: NEGATIVE
Protein, ur: NEGATIVE mg/dL
Specific Gravity, Urine: 1.01 (ref 1.005–1.030)
pH: 6 (ref 5.0–8.0)

## 2021-12-31 LAB — COMPREHENSIVE METABOLIC PANEL
ALT: 15 U/L (ref 0–44)
AST: 22 U/L (ref 15–41)
Albumin: 4 g/dL (ref 3.5–5.0)
Alkaline Phosphatase: 57 U/L (ref 38–126)
Anion gap: 7 (ref 5–15)
BUN: 14 mg/dL (ref 6–20)
CO2: 24 mmol/L (ref 22–32)
Calcium: 9.4 mg/dL (ref 8.9–10.3)
Chloride: 106 mmol/L (ref 98–111)
Creatinine, Ser: 0.82 mg/dL (ref 0.44–1.00)
GFR, Estimated: >60 mL/min (ref >60)
Glucose, Bld: 79 mg/dL (ref 70–99)
Potassium: 3.9 mmol/L (ref 3.5–5.1)
Sodium: 137 mmol/L (ref 135–145)
Total Bilirubin: 0.8 mg/dL (ref 0.3–1.2)
Total Protein: 7.7 g/dL (ref 6.5–8.1)

## 2021-12-31 LAB — I-STAT BETA HCG BLOOD, ED (MC, WL, AP ONLY): I-stat hCG, quantitative: <5 m[IU]/mL (ref <5)

## 2021-12-31 LAB — LIPASE, BLOOD: Lipase: 69 U/L — ABNORMAL HIGH (ref 11–51)

## 2021-12-31 LAB — HIV ANTIBODY (ROUTINE TESTING W REFLEX): HIV Screen 4th Generation wRfx: NONREACTIVE

## 2021-12-31 NOTE — ED Provider Triage Note (Signed)
Emergency Medicine Provider Triage Evaluation Note  Tiffany Cervantes , a 21 y.o. female  was evaluated in triage.  Pt complains of abdominal cramping onset 1-2 weeks.  Notes that she started being sexually active with female partners for the first time approximately 2 months ago.  Has taken a Plan B in the past.  Wants to know if she is pregnant today.  Also notes dysuria that is malodorous.  No meds tried prior to arrival.  Denies fever, chills, nausea, vomiting.  Review of Systems  Positive: As per HPI above Negative:   Physical Exam  BP 126/82 (BP Location: Left Arm)   Pulse 84   Temp 98.4 F (36.9 C) (Oral)   Resp 14   Ht 5\' 3"  (1.6 m)   Wt 56.7 kg   LMP 12/10/2021 (Approximate)   SpO2 100%   BMI 22.14 kg/m  Gen:   Awake, no distress   Resp:  Normal effort  MSK:   Moves extremities without difficulty  Other:  Diffuse abdominal tenderness to palpation.  Medical Decision Making  Medically screening exam initiated at 12:55 PM.  Appropriate orders placed.  Tiffany Cervantes was informed that the remainder of the evaluation will be completed by another provider, this initial triage assessment does not replace that evaluation, and the importance of remaining in the ED until their evaluation is complete.  Work-up initiated.   Tiffany Cervantes A, PA-C 12/31/21 1256

## 2021-12-31 NOTE — ED Triage Notes (Signed)
Patient c/o lower abdominal pain, right lower back pain, foul smelling and dysuria x 1-2 weeks. Patient states she took a plan B pill 2 weeks ago. Patient states she has been having unprotected sex.

## 2021-12-31 NOTE — ED Notes (Signed)
Patient has urine culture in the main lab °

## 2022-01-01 DIAGNOSIS — A599 Trichomoniasis, unspecified: Secondary | ICD-10-CM | POA: Diagnosis not present

## 2022-01-01 LAB — WET PREP, GENITAL
Clue Cells Wet Prep HPF POC: NONE SEEN
Sperm: NONE SEEN
WBC, Wet Prep HPF POC: <10 (ref <10)
Yeast Wet Prep HPF POC: NONE SEEN

## 2022-01-01 LAB — RPR: RPR Ser Ql: NONREACTIVE

## 2022-01-01 LAB — GC/CHLAMYDIA PROBE AMP (~~LOC~~) NOT AT ARMC
Chlamydia: NEGATIVE
Comment: NEGATIVE
Comment: NORMAL
Neisseria Gonorrhea: NEGATIVE

## 2022-01-01 MED ORDER — METRONIDAZOLE 500 MG PO TABS
2000.0000 mg | ORAL_TABLET | Freq: Once | ORAL | Status: AC
  Administered 2022-01-01: 2000 mg via ORAL
  Filled 2022-01-01: qty 4

## 2022-01-01 MED ORDER — AZITHROMYCIN 250 MG PO TABS: 1000.0000 mg | ORAL_TABLET | Freq: Every day | ORAL | Status: DC

## 2022-01-01 MED ORDER — CEFTRIAXONE SODIUM 1 G IJ SOLR
500.0000 mg | Freq: Once | INTRAMUSCULAR | Status: AC
  Administered 2022-01-01: 500 mg via INTRAMUSCULAR
  Filled 2022-01-01: qty 10

## 2022-01-01 MED ORDER — AZITHROMYCIN 250 MG PO TABS
1000.0000 mg | ORAL_TABLET | Freq: Once | ORAL | Status: AC
Start: 2022-01-01 — End: 2022-01-01
  Administered 2022-01-01: 1000 mg via ORAL
  Filled 2022-01-01: qty 4

## 2022-01-01 NOTE — Discharge Instructions (Signed)
As we discussed, your swab today did test positive for trichomonas.  Gonorrhea and Chlamydia testing is still pending, however you have been treated for this today as well as for the trichomonas.  You will still be notified if gonorrhea/chlamydia testing is positive. You do need to discuss with your partner and make sure that they are treated as well.  This can be done at the health department.  Abstinence recommended for 7-10 days until treatment has fully taken effect. Return here for any new or acute changes.

## 2022-01-01 NOTE — ED Provider Notes (Signed)
[facility]-EMERGENCY DEPT Provider Note   CSN: [id number] Arrival date & time: 12/31/21  1226     History  Chief Complaint  Patient presents with   Abdominal Pain   Back Pain    Tiffany Cervantes is a 21 y.o. female.  The history is provided by the patient and medical records.  Abdominal Pain Back Pain Associated symptoms: abdominal pain    21 year old female presenting to the ED with lower abdominal discomfort and dysuria for the past week.  Recently started having sex with sexual partner for the first time 2 months ago.  She denies any new partner since that time.  States unprotected sex, did take a Plan B pill 2 weeks ago.  She is concerned for possible pregnancy.  She denies any irregular vaginal discharge or bleeding.  She reports intermittent dysuria, seems to be occurring at random times.  She denies any pelvic pain.  No nausea or vomiting.  No genital lesions.  Home Medications Prior to Admission medications   Medication Sig Start Date End Date Taking? Authorizing Provider  naproxen (NAPROSYN) 500 MG tablet Take 1 tablet (500 mg total) by mouth 2 (two) times daily. Patient not taking: Reported on 12/31/2021 02/18/19   Hall-Potvin, [place], PA-C      Allergies    Patient has no known allergies.    Review of Systems   Review of Systems  Gastrointestinal:  Positive for abdominal pain.  Musculoskeletal:  Positive for back pain.  All other systems reviewed and are negative.   Physical Exam Updated Vital Signs BP 140/86   Pulse 70   Temp 98.4 F (36.9 C) (Oral)   Resp 15   Ht 5\' 3"  (1.6 m)   Wt 56.7 kg   LMP 12/10/2021 (Approximate)   SpO2 99%   BMI 22.14 kg/m   Physical Exam Vitals and nursing note reviewed.  Constitutional:      Appearance: She is well-developed.  HENT:     Head: Normocephalic and atraumatic.  Eyes:     Conjunctiva/sclera: Conjunctivae normal.     Pupils: Pupils are equal, round, and reactive to light.   Cardiovascular:     Rate and Rhythm: Normal rate and regular rhythm.     Heart sounds: Normal heart sounds.  Pulmonary:     Effort: Pulmonary effort is normal.     Breath sounds: Normal breath sounds.  Abdominal:     General: Bowel sounds are normal.     Palpations: Abdomen is soft.     Tenderness: There is no abdominal tenderness. There is no guarding or rebound.     Comments: Soft, non-tender, no rebound or guarding  Genitourinary:    Comments: Self swabs performed Musculoskeletal:        General: Normal range of motion.     Cervical back: Normal range of motion.  Skin:    General: Skin is warm and dry.  Neurological:     Mental Status: She is alert and oriented to person, place, and time.     ED Results / Procedures / Treatments   Labs (all labs ordered are listed, but only abnormal results are displayed) Labs Reviewed  WET PREP, GENITAL - Abnormal; Notable for the following components:      Result Value   Trich, Wet Prep PRESENT (*)    All other components within normal limits  URINALYSIS, ROUTINE W REFLEX MICROSCOPIC - Abnormal; Notable for the following components:   APPearance HAZY (*)    Leukocytes,Ua SMALL (*)  All other components within normal limits  LIPASE, BLOOD - Abnormal; Notable for the following components:   Lipase 69 (*)    All other components within normal limits  URINALYSIS, MICROSCOPIC (REFLEX) - Abnormal; Notable for the following components:   Bacteria, UA RARE (*)    All other components within normal limits  CBC WITH DIFFERENTIAL/PLATELET  COMPREHENSIVE METABOLIC PANEL  HIV ANTIBODY (ROUTINE TESTING W REFLEX)  RPR  I-STAT BETA HCG BLOOD, ED ([facility], [facility], [facility] ONLY)  GC/CHLAMYDIA PROBE AMP ([place]) NOT AT [facility]    EKG None  Radiology No results found.  Procedures Procedures    Medications Ordered in ED Medications  metroNIDAZOLE (FLAGYL) tablet 2,000 mg (2,000 mg Oral Given 01/01/22 0240)  cefTRIAXone (ROCEPHIN) injection 500 mg  (500 mg Intramuscular Given 01/01/22 0241)  azithromycin (ZITHROMAX) tablet 1,000 mg (1,000 mg Oral Given 01/01/22 0240)    ED Course/ Medical Decision Making/ A&P                           Medical Decision Making Amount and/or Complexity of Data Reviewed Labs: ordered. ECG/medicine tests: ordered and independent interpretation performed.  Risk Prescription drug management.   21 year old female presenting to the ED with lower abdominal discomfort and intermittent dysuria over the past week.  Recently started having intercourse 2 months ago, single female partner since then.  Does report unprotected sex, used Plan B 2 weeks ago.  She is concerned for possible pregnancy.  She denies any irregular bleeding or vaginal discharge.  No pelvic pain.    She is afebrile, nontoxic in appearance.  Her abdomen is soft and nontender.  No tenderness into the pelvis.  Labs were obtained from triage--no leukocytosis, no electrolyte derangement.  Lipase is 69 but this is not felt to be clinically significant.  HIV is negative.  RPR is in process.  Self swabs were obtained prior to my evaluation, wet prep is positive for trichomonas.  Gonorrhea/chlamydia testing is pending.  Results discussed with patient, she acknowledged understanding.  Discussed risk versus benefits of empirically treating for Gc/Chl along with trichomonas and she is agreeable.  Advised she will need to share results with her partner so they can be tested/treated as well.  Encouraged to remain abstinent for the next 7 to 10 days for medication to take full effect.  She can follow-up with PCP and/or GYN if ongoing issues.  Return here for new concerns.  Final Clinical Impression(s) / ED Diagnoses Final diagnoses:  Trichomonas infection    Rx / DC Orders ED Discharge Orders     None         Garlon Hatchet, PA-C 01/01/22 0554    Shon Baton, MD 01/01/22 [phone]
